# Patient Record
Sex: Female | Born: 1995 | Race: Black or African American | Hispanic: No | Marital: Single | Smoking: Never smoker
Health system: Southern US, Community
[De-identification: ages and names within clinical notes are randomized; demographics above are authoritative.]

## PROBLEM LIST (undated history)

## (undated) DIAGNOSIS — D571 Sickle-cell disease without crisis: Secondary | ICD-10-CM

## (undated) HISTORY — DX: Sickle-cell disease without crisis: D57.1

## (undated) HISTORY — PX: NO PAST SURGERIES: SHX2092

---
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
## 2018-09-04 ENCOUNTER — Emergency Department (HOSPITAL_COMMUNITY): Admission: EM | Admit: 2018-09-04 | Discharge: 2018-09-05 | Discharge status: Absent without leave | Payer: Self-pay

## 2018-09-05 ENCOUNTER — Emergency Department (HOSPITAL_COMMUNITY): Payer: BLUE CROSS/BLUE SHIELD

## 2018-09-05 ENCOUNTER — Other Ambulatory Visit: Payer: Self-pay

## 2018-09-05 ENCOUNTER — Encounter (HOSPITAL_COMMUNITY): Payer: Self-pay | Admitting: Emergency Medicine

## 2018-09-05 ENCOUNTER — Observation Stay (HOSPITAL_COMMUNITY)
Admission: EM | Admit: 2018-09-05 | Discharge: 2018-09-06 | Disposition: A | Discharge status: Home / Self Care | Payer: BLUE CROSS/BLUE SHIELD | Attending: Internal Medicine | Admitting: Internal Medicine

## 2018-09-05 DIAGNOSIS — M542 Cervicalgia: Secondary | ICD-10-CM | POA: Diagnosis not present

## 2018-09-05 DIAGNOSIS — D72829 Elevated white blood cell count, unspecified: Secondary | ICD-10-CM

## 2018-09-05 DIAGNOSIS — M549 Dorsalgia, unspecified: Secondary | ICD-10-CM

## 2018-09-05 DIAGNOSIS — G039 Meningitis, unspecified: Principal | ICD-10-CM | POA: Diagnosis present

## 2018-09-05 DIAGNOSIS — A419 Sepsis, unspecified organism: Secondary | ICD-10-CM

## 2018-09-05 DIAGNOSIS — R509 Fever, unspecified: Secondary | ICD-10-CM | POA: Diagnosis not present

## 2018-09-05 DIAGNOSIS — A4189 Other specified sepsis: Secondary | ICD-10-CM | POA: Insufficient documentation

## 2018-09-05 DIAGNOSIS — M545 Low back pain: Secondary | ICD-10-CM | POA: Insufficient documentation

## 2018-09-05 DIAGNOSIS — G44021 Chronic cluster headache, intractable: Secondary | ICD-10-CM

## 2018-09-05 DIAGNOSIS — R1110 Vomiting, unspecified: Secondary | ICD-10-CM | POA: Insufficient documentation

## 2018-09-05 DIAGNOSIS — D573 Sickle-cell trait: Secondary | ICD-10-CM | POA: Insufficient documentation

## 2018-09-05 DIAGNOSIS — A879 Viral meningitis, unspecified: Secondary | ICD-10-CM

## 2018-09-05 DIAGNOSIS — R51 Headache: Secondary | ICD-10-CM | POA: Diagnosis not present

## 2018-09-05 DIAGNOSIS — R109 Unspecified abdominal pain: Secondary | ICD-10-CM

## 2018-09-05 DIAGNOSIS — Z832 Family history of diseases of the blood and blood-forming organs and certain disorders involving the immune mechanism: Secondary | ICD-10-CM | POA: Diagnosis not present

## 2018-09-05 DIAGNOSIS — R291 Meningismus: Secondary | ICD-10-CM

## 2018-09-05 DIAGNOSIS — R112 Nausea with vomiting, unspecified: Secondary | ICD-10-CM

## 2018-09-05 LAB — CBC
HEMATOCRIT: 40.4 % (ref 36.0–46.0)
Hemoglobin: 13.2 g/dL (ref 12.0–15.0)
MCH: 27.7 pg (ref 26.0–34.0)
MCHC: 32.7 g/dL (ref 30.0–36.0)
MCV: 84.9 fL (ref 80.0–100.0)
Platelets: 287 10*3/uL (ref 150–400)
RBC: 4.76 MIL/uL (ref 3.87–5.11)
RDW: 12.2 % (ref 11.5–15.5)
WBC: 3.1 10*3/uL — ABNORMAL LOW (ref 4.0–10.5)
nRBC: 0 % (ref 0.0–0.2)

## 2018-09-05 LAB — PROTEIN, CSF: Total  Protein, CSF: 139 mg/dL — ABNORMAL HIGH (ref 15–45)

## 2018-09-05 LAB — CSF CELL COUNT WITH DIFFERENTIAL
EOS CSF: 0 % (ref 0–1)
Eosinophils, CSF: 0 % (ref 0–1)
Lymphs, CSF: 94 % — ABNORMAL HIGH (ref 40–80)
Lymphs, CSF: 95 % — ABNORMAL HIGH (ref 40–80)
Monocyte-Macrophage-Spinal Fluid: 4 % — ABNORMAL LOW (ref 15–45)
Monocyte-Macrophage-Spinal Fluid: 5 % — ABNORMAL LOW (ref 15–45)
RBC Count, CSF: 14 /mm3 — ABNORMAL HIGH
RBC Count, CSF: 22 /mm3 — ABNORMAL HIGH
SEGMENTED NEUTROPHILS-CSF: 1 % (ref 0–6)
Segmented Neutrophils-CSF: 1 % (ref 0–6)
TUBE #: 1
TUBE #: 4
WBC, CSF: 425 /mm3 (ref 0–5)
WBC, CSF: 455 /mm3 (ref 0–5)

## 2018-09-05 LAB — COMPREHENSIVE METABOLIC PANEL
ALK PHOS: 36 U/L — AB (ref 38–126)
ALT: 12 U/L (ref 0–44)
AST: 18 U/L (ref 15–41)
Albumin: 3.9 g/dL (ref 3.5–5.0)
Anion gap: 8 (ref 5–15)
BUN: 10 mg/dL (ref 6–20)
CHLORIDE: 106 mmol/L (ref 98–111)
CO2: 26 mmol/L (ref 22–32)
CREATININE: 0.94 mg/dL (ref 0.44–1.00)
Calcium: 9 mg/dL (ref 8.9–10.3)
GFR calc Af Amer: >60 mL/min (ref >60)
GFR calc non Af Amer: >60 mL/min (ref >60)
Glucose, Bld: 113 mg/dL — ABNORMAL HIGH (ref 70–99)
Potassium: 4.2 mmol/L (ref 3.5–5.1)
SODIUM: 140 mmol/L (ref 135–145)
Total Bilirubin: 0.5 mg/dL (ref 0.3–1.2)
Total Protein: 6.8 g/dL (ref 6.5–8.1)

## 2018-09-05 LAB — LIPASE, BLOOD: LIPASE: 56 U/L — AB (ref 11–51)

## 2018-09-05 LAB — I-STAT BETA HCG BLOOD, ED (MC, WL, AP ONLY): I-stat hCG, quantitative: <5 m[IU]/mL (ref <5)

## 2018-09-05 LAB — PATHOLOGIST SMEAR REVIEW

## 2018-09-05 LAB — RAPID HIV SCREEN (HIV 1/2 AB+AG)
HIV 1/2 Antibodies: NONREACTIVE
HIV-1 P24 Antigen - HIV24: NONREACTIVE

## 2018-09-05 LAB — GLUCOSE, CSF: Glucose, CSF: 38 mg/dL — ABNORMAL LOW (ref 40–70)

## 2018-09-05 LAB — I-STAT CG4 LACTIC ACID, ED: LACTIC ACID, VENOUS: 1.36 mmol/L (ref 0.5–1.9)

## 2018-09-05 MED ORDER — ACETAMINOPHEN 325 MG PO TABS
650.0000 mg | ORAL_TABLET | Freq: Once | ORAL | Status: AC | PRN
  Administered 2018-09-05: 650 mg via ORAL
  Filled 2018-09-05: qty 2

## 2018-09-05 MED ORDER — VANCOMYCIN HCL IN DEXTROSE 750-5 MG/150ML-% IV SOLN
750.0000 mg | Freq: Once | INTRAVENOUS | Status: DC
  Filled 2018-09-05: qty 150

## 2018-09-05 MED ORDER — KETOROLAC TROMETHAMINE 15 MG/ML IJ SOLN
15.0000 mg | Freq: Once | INTRAMUSCULAR | Status: AC
  Administered 2018-09-05: 15 mg via INTRAVENOUS
  Filled 2018-09-05: qty 1

## 2018-09-05 MED ORDER — SODIUM CHLORIDE 0.9 % IV BOLUS
1000.0000 mL | Freq: Once | INTRAVENOUS | Status: AC
  Administered 2018-09-05: 1000 mL via INTRAVENOUS

## 2018-09-05 MED ORDER — VANCOMYCIN HCL IN DEXTROSE 750-5 MG/150ML-% IV SOLN
750.0000 mg | Freq: Two times a day (BID) | INTRAVENOUS | Status: DC
  Administered 2018-09-05 – 2018-09-06 (×2): 750 mg via INTRAVENOUS
  Filled 2018-09-05 (×2): qty 150

## 2018-09-05 MED ORDER — SODIUM CHLORIDE 0.9 % IV SOLN
2.0000 g | Freq: Once | INTRAVENOUS | Status: AC
  Administered 2018-09-05: 2 g via INTRAVENOUS
  Filled 2018-09-05 (×2): qty 20

## 2018-09-05 MED ORDER — ONDANSETRON HCL 4 MG/2ML IJ SOLN: 4.0000 mg | Freq: Four times a day (QID) | INTRAMUSCULAR | Status: DC | PRN

## 2018-09-05 MED ORDER — ACETAMINOPHEN 325 MG PO TABS
650.0000 mg | ORAL_TABLET | Freq: Four times a day (QID) | ORAL | Status: DC | PRN
  Administered 2018-09-06 (×2): 650 mg via ORAL
  Filled 2018-09-05 (×2): qty 2

## 2018-09-05 MED ORDER — VANCOMYCIN HCL IN DEXTROSE 1-5 GM/200ML-% IV SOLN
1000.0000 mg | Freq: Once | INTRAVENOUS | Status: AC
  Administered 2018-09-05: 1000 mg via INTRAVENOUS
  Filled 2018-09-05 (×2): qty 200

## 2018-09-05 MED ORDER — SODIUM CHLORIDE 0.9 % IV SOLN: 2.0000 g | Freq: Two times a day (BID) | INTRAVENOUS | Status: DC

## 2018-09-05 MED ORDER — DEXAMETHASONE SODIUM PHOSPHATE 10 MG/ML IJ SOLN
10.0000 mg | Freq: Once | INTRAMUSCULAR | Status: AC
  Administered 2018-09-05: 10 mg via INTRAVENOUS
  Filled 2018-09-05: qty 1

## 2018-09-05 MED ORDER — SODIUM CHLORIDE 0.9 % IV SOLN
INTRAVENOUS | Status: DC
  Administered 2018-09-05 – 2018-09-06 (×3): via INTRAVENOUS

## 2018-09-05 MED ORDER — METOCLOPRAMIDE HCL 5 MG/ML IJ SOLN
10.0000 mg | Freq: Once | INTRAMUSCULAR | Status: AC
  Administered 2018-09-05: 10 mg via INTRAVENOUS
  Filled 2018-09-05: qty 2

## 2018-09-05 MED ORDER — DEXAMETHASONE SODIUM PHOSPHATE 10 MG/ML IJ SOLN
10.0000 mg | Freq: Four times a day (QID) | INTRAMUSCULAR | Status: DC
  Administered 2018-09-05 – 2018-09-06 (×4): 10 mg via INTRAVENOUS
  Filled 2018-09-05 (×5): qty 1

## 2018-09-05 MED ORDER — DIPHENHYDRAMINE HCL 50 MG/ML IJ SOLN
25.0000 mg | Freq: Once | INTRAMUSCULAR | Status: AC
  Administered 2018-09-05: 25 mg via INTRAVENOUS
  Filled 2018-09-05: qty 1

## 2018-09-05 MED ORDER — ENOXAPARIN SODIUM 40 MG/0.4ML ~~LOC~~ SOLN
40.0000 mg | SUBCUTANEOUS | Status: DC
  Filled 2018-09-05: qty 0.4

## 2018-09-05 MED ORDER — KETOROLAC TROMETHAMINE 30 MG/ML IJ SOLN
30.0000 mg | Freq: Four times a day (QID) | INTRAMUSCULAR | Status: DC | PRN
  Administered 2018-09-05 – 2018-09-06 (×2): 30 mg via INTRAVENOUS
  Filled 2018-09-05 (×3): qty 1

## 2018-09-05 MED ORDER — SODIUM CHLORIDE 0.9 % IV SOLN
2.0000 g | Freq: Two times a day (BID) | INTRAVENOUS | Status: DC
  Administered 2018-09-05 – 2018-09-06 (×2): 2 g via INTRAVENOUS
  Filled 2018-09-05 (×2): qty 20

## 2018-09-05 MED ORDER — MORPHINE SULFATE (PF) 4 MG/ML IV SOLN
4.0000 mg | Freq: Once | INTRAVENOUS | Status: AC
  Administered 2018-09-05: 4 mg via INTRAVENOUS
  Filled 2018-09-05: qty 1

## 2018-09-05 NOTE — ED Notes (Signed)
Patient transported to CT 

## 2018-09-05 NOTE — ED Notes (Signed)
[facility] Critical Results given to Dr. Manus Gunning

## 2018-09-05 NOTE — H&P (Addendum)
[facility]    Date of Admission:  09/05/2018   Total days of antibiotics 1          Reason for Consult: Bacterial vs Viral Meningitis    Referring Provider: dr Hanley Ben Primary Care Provider: [facility]  Assessment: Ann Armstrong is a 22 y.o. female with medical history significant of sickle cell trait admitted for concerns of bacterial vs viral meningitis. Tmax on admission 102.7, White count 3.1.  Lactic acid normal.  Chest x-ray showing no active cardiopulmonary disease.  Head CT without acute abnormality.  LP was done in the ED. CSF glucose low (38), protein high (139), WBC high (455).  Differential showing 95% lymphocytes.  CSF culture pending.   Patient received vancomycin, ceftriaxone, and IV Decadron 10 mg in the ED. CSF culture is pending but cells are most consistent with a viral meningitis/aseptic meningitis. Pt reports she is uptodate on vaccinations and is sexually active. She feels improved within 12hrs of management of care.  Plan: 1. Will continue ceftriaxone 2g Q12  Plus vancomycin 2. Monitor fevers, repeat blood cultures if fevers return 3. Continue IV decadron 10mg  q6 4. Follow up on HSV and enterovirus PCR on CSF 5. Follow up on CSF culture 6. Will consider antivirals pending lab results 7. Will discontinue droplet precautions  Principal Problem:   Meningitis Active Problems:   Sepsis (HCC)   Scheduled Meds: . dexamethasone  10 mg Intravenous Q6H  . enoxaparin (LOVENOX) injection  40 mg Subcutaneous Q24H   Continuous Infusions: . sodium chloride Stopped (09/05/18 0719)  . cefTRIAXone (ROCEPHIN)  IV    . vancomycin     PRN Meds:.acetaminophen, ketorolac, ondansetron (ZOFRAN) IV  HPI: Ann Armstrong is a 22 y.o. female with pmhx of sicke cell trait. Pt reports on Tuesday she woke up with a a headache. Thursday she started having lower back pain and fevers. Her neck also started hurting and she had  numerous episodes of emesis. She went to the urgent care Saturday and they started IVF and she began to feel better so they did not pursue an LP. She went to the doctor yesterday because her symptoms progressed and they pursued a head CT which was unrevealing. Pt presented to the ED last night and is accompanied by her mom for headache and fever. She reports that she is feeling better since starting IVF, steroids, abtx and pain medication. 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 Negative for chills.  Eyes: Negative for blurred vision, double vision and photophobia.  Cardiovascular: Negative for chest pain.  Gastrointestinal: Positive for abdominal pain and vomiting.  Musculoskeletal: Positive for back pain and neck pain.  Neurological: Positive for headaches. Negative for weakness.    Past Medical History:  Diagnosis Date  . Sickle cell anemia (HCC)    positive for sickle cell trait- refer to [id number]    Social History   Tobacco Use  . Smoking status: Never Smoker  . Smokeless tobacco: Never Used  Substance Use Topics  . Alcohol use: No  . Drug use: No    Family History  Problem Relation Age of Onset  . Sickle cell trait Father   . Heart disease Maternal Grandmother   .  Hyperlipidemia Maternal Grandmother   . Hypertension Maternal Grandmother   . Stroke Maternal Grandmother   . Arthritis Maternal Grandfather   . Cancer Maternal Grandfather   . Hyperlipidemia Maternal Grandfather   . Hypertension Maternal Grandfather   . Alcohol abuse Paternal Grandmother   . Asthma Neg Hx   . Birth defects Neg Hx   . COPD Neg Hx   . Depression Neg Hx   . Diabetes Neg Hx   . Drug abuse Neg Hx   . Early death Neg Hx   . Hearing loss Neg Hx   .  Kidney disease Neg Hx   . Learning disabilities Neg Hx   . Mental illness Neg Hx   . Mental retardation Neg Hx   . Miscarriages / Stillbirths Neg Hx   . Vision loss Neg Hx   . Varicose Veins Neg Hx    No Known Allergies  OBJECTIVE: BP 126/89   Pulse 81   Temp 98.8 F (37.1 C) (Oral)   Resp 20   Ht 5\' 2"  (1.575 m)   Wt 54.4 kg   LMP 08/31/2018   SpO2 100%   BMI 21.95 kg/m  Physical Exam  Constitutional:  oriented to person, place, and time. appears well-developed and well-nourished. No distress.  HENT: [place]/AT, PERRLA, no scleral icterus + nuchal rigidity Mouth/Throat: Oropharynx is clear and moist. No oropharyngeal exudate.  Cardiovascular: Normal rate, regular rhythm and normal heart sounds. Exam reveals no gallop and no friction rub.  No murmur heard.  Pulmonary/Chest: Effort normal and breath sounds normal. No respiratory distress.  has no wheezes.  Neck = supple, no nuchal rigidity Abdominal: Soft. Bowel sounds are normal.  exhibits no distension. There is no tenderness.  Lymphadenopathy: no cervical adenopathy. No axillary adenopathy Neurological: alert and oriented to person, place, and time.  Skin: Skin is warm and dry. No rash noted. No erythema.  Psychiatric: a normal mood and affect.  behavior is normal.   Lab Results Lab Results  Component Value Date   WBC 3.1 (L) 09/05/2018   HGB 13.2 09/05/2018   HCT 40.4 09/05/2018   MCV 84.9 09/05/2018   PLT 287 09/05/2018    Lab Results  Component Value Date   CREATININE 0.94 09/05/2018   BUN 10 09/05/2018   NA 140 09/05/2018   K 4.2 09/05/2018   CL 106 09/05/2018   CO2 26 09/05/2018    Lab Results  Component Value Date   ALT 12 09/05/2018   AST 18 09/05/2018   ALKPHOS 36 (L) 09/05/2018   BILITOT 0.5 09/05/2018     Microbiology: Recent Results (from the past 240 hour(s))  CSF culture with Stat gram stain     Status: None (Preliminary result)   Collection Time: 09/05/18  3:03 AM  Result Value Ref Range  Status   Specimen Description CSF  Final   Special Requests NONE  Final   Gram Stain   Final    WBC PRESENT, PREDOMINANTLY MONONUCLEAR NO ORGANISMS SEEN CYTOSPIN SMEAR Performed at [facility], [address], [place], [place] [address]    Culture PENDING  Incomplete   Report Status PENDING  Incomplete    April  Eulah Citizen [facility]  09/05/2018, 12:24 PM  ---------------------  I have seen and examined the patient with our medical student, April peterson. I have made additions to the physical exam and plan.  22yo F in graduate studies who has new onset of fever, headache, neck pain with N/V. In the ED,  she underwent LP found to have lymphocytic predominance pleocytosis on CSF analysis. Gram stain negative. Glucose was mildly lower than expected.  - recommend to keep vanco and ceftriaxone til tomorrow am. If cultures still ngtd, can d/c. Anticipated to be aseptic mengitis/viral meningitis. - steroids may help HA symptoms as well   Baili Stang B. Drue Second MD MPH [facility] [phone]

## 2018-09-05 NOTE — ED Triage Notes (Signed)
Pt BIB family, reports headache x 1 week with vomiting and lower back pain that radiates to neck. Fevers x 4 days, mild abdominal pain. Pt seen at [facility] on Saturday, given meloxicam with no relief, pt also seen at [facility] ED for same symptoms, and seen at [facility] today as well. Mother reports CT scan ordered for tomorrow.

## 2018-09-05 NOTE — Progress Notes (Signed)
Pharmacy Antibiotic Note  Ann Armstrong is a 22 y.o. female admitted on 09/05/2018 with sx concerning for meningitis.  Pharmacy has been consulted for vancomycin dosing; also started on Rocephin appropriately.  Plan: Vancomycin 1000mg  x1 then 750mg  IV every 12 hours.  Goal trough 15-20 mcg/mL.  Height: 5\' 2"  (157.5 cm) Weight: 120 lb (54.4 kg) IBW/kg (Calculated) : 50.1  Temp (24hrs), Avg:100.3 F (37.9 C), Min:99 F (37.2 C), Max:102.7 F (39.3 C)  Recent Labs  Lab 09/05/18 0034 09/05/18 0039  WBC 3.1*  --   CREATININE 0.94  --   LATICACIDVEN  --  1.36    Estimated Creatinine Clearance: 74.2 mL/min (by C-G formula based on SCr of 0.94 mg/dL).    No Known Allergies   Thank you for allowing pharmacy to be a part of this patient's care.  Vernard Gambles, PharmD, BCPS  09/05/2018 6:20 AM

## 2018-09-05 NOTE — Progress Notes (Signed)
Patient ID: Ann Armstrong, female   DOB: January 27, 1996, 22 y.o.   MRN: [id number] Patient was admitted early this morning for headaches and fever and started on Rocephin and vancomycin for meningitis.  Patient seen and examined at bedside.  Plan of care discussed with her.  This morning's H&P, labs and medications were reviewed by myself.  Follow CSF cultures.  Gram stain is negative so far.  We will add CSF HSV PCR and enterovirus PCR.  Spoke to Dr. Jerolyn Center about the same.  Repeat a.m. labs.

## 2018-09-05 NOTE — H&P (Addendum)
History and Physical    Ann Armstrong:[id number] DOB: 1996/03/19 DOA: 09/05/2018  PCP: [facility] Patient coming from: Home  Chief Complaint: Headaches, fever  HPI: Ann Armstrong is a 22 y.o. female with medical history significant of sickle cell trait resenting to the hospital for further evaluation of headaches and fever.  Patient states he started having bilateral temporal and orbital headaches a week ago.  5 days ago she started experiencing pain in her neck and lower back.  It is painful for her to flex her neck.  She has been having fevers and chills for the past 3 days.  Started vomiting 2 days ago.   ED Course: Temp 102.7.  White count 3.1.  Lactic acid normal.  Chest x-ray showing no active cardiopulmonary disease.  Head CT without acute abnormality.  LP was done in the ED. CSF glucose low (38), protein high (139), WBC high (455).  Differential showing 95% lymphocytes.  CSF culture pending.  Rapid HIV screen negative.  Patient received vancomycin, ceftriaxone, and IV Decadron 10 mg in the ED.  Received 2 L IV fluid boluses.  TRH patient to admit.  Review of Systems: As per HPI otherwise 10 point review of systems negative.  Past Medical History:  Diagnosis Date  . Sickle cell anemia (HCC)    positive for sickle cell trait- refer to [id number]    History reviewed. No pertinent surgical history.   reports that she has never smoked. She has never used smokeless tobacco. She reports that she does not drink alcohol or use drugs.  No Known Allergies  Family History  Problem Relation Age of Onset  . Sickle cell trait Father   . Heart disease Maternal Grandmother   . Hyperlipidemia Maternal Grandmother   . Hypertension Maternal Grandmother   . Stroke Maternal Grandmother   . Arthritis Maternal Grandfather   . Cancer Maternal Grandfather   . Hyperlipidemia Maternal Grandfather   . Hypertension Maternal Grandfather   . Alcohol abuse Paternal  Grandmother   . Asthma Neg Hx   . Birth defects Neg Hx   . COPD Neg Hx   . Depression Neg Hx   . Diabetes Neg Hx   . Drug abuse Neg Hx   . Early death Neg Hx   . Hearing loss Neg Hx   . Kidney disease Neg Hx   . Learning disabilities Neg Hx   . Mental illness Neg Hx   . Mental retardation Neg Hx   . Miscarriages / Stillbirths Neg Hx   . Vision loss Neg Hx   . Varicose Veins Neg Hx     Prior to Admission medications   Medication Sig Start Date End Date Taking? Authorizing Provider  acetaminophen (TYLENOL) 325 MG tablet Take 650 mg by mouth every 6 (six) hours as needed for mild pain or fever.   Yes [provider]  ibuprofen (ADVIL,MOTRIN) 200 MG tablet Take 200 mg by mouth every 6 (six) hours as needed for fever or mild pain.   Yes [provider]    Physical Exam: Vitals:   09/05/18 0330 09/05/18 0400 09/05/18 0430 09/05/18 0527  BP: 115/80 119/83 116/75 108/71  Pulse: 91 99 97 98  Resp: 17 (!) 21 16 16   Temp:    99 F (37.2 C)  TempSrc:    Oral  SpO2: 99% 100% 99% 98%  Weight:    54.4 kg  Height:    5\' 2"  (1.575 m)   Physical Exam  Constitutional: She is oriented to person, place, and time. No distress.  Resting comfortably in a hospital bed  HENT:  Head: Normocephalic and atraumatic.  Mouth/Throat: Oropharynx is clear and moist.  Eyes: Right eye exhibits no discharge. Left eye exhibits no discharge.  Neck: Neck supple. No tracheal deviation present.  Cardiovascular: Normal rate, regular rhythm and intact distal pulses.  Pulmonary/Chest: Effort normal and breath sounds normal. No respiratory distress. She has no wheezes. She has no rales.  Abdominal: Soft. Bowel sounds are normal. She exhibits no distension. There is no tenderness.  Musculoskeletal: She exhibits no edema.  Neurological: She is alert and oriented to person, place, and time.  Skin: Skin is warm and dry. She is not diaphoretic.  Psychiatric: Her behavior is normal.     Labs on  Admission: I have personally reviewed following labs and imaging studies  CBC: Recent Labs  Lab 09/05/18 0034  WBC 3.1*  HGB 13.2  HCT 40.4  MCV 84.9  PLT 287   Basic Metabolic Panel: Recent Labs  Lab 09/05/18 0034  NA 140  K 4.2  CL 106  CO2 26  GLUCOSE 113*  BUN 10  CREATININE 0.94  CALCIUM 9.0   GFR: Estimated Creatinine Clearance: 74.2 mL/min (by C-G formula based on SCr of 0.94 mg/dL). Liver Function Tests: Recent Labs  Lab 09/05/18 0034  AST 18  ALT 12  ALKPHOS 36*  BILITOT 0.5  PROT 6.8  ALBUMIN 3.9   Recent Labs  Lab 09/05/18 0034  LIPASE 56*   No results for input(s): AMMONIA in the last 168 hours. Coagulation Profile: No results for input(s): INR, PROTIME in the last 168 hours. Cardiac Enzymes: No results for input(s): CKTOTAL, CKMB, CKMBINDEX, TROPONINI in the last 168 hours. BNP (last 3 results) No results for input(s): PROBNP in the last 8760 hours. HbA1C: No results for input(s): HGBA1C in the last 72 hours. CBG: No results for input(s): GLUCAP in the last 168 hours. Lipid Profile: No results for input(s): CHOL, HDL, LDLCALC, TRIG, CHOLHDL, LDLDIRECT in the last 72 hours. Thyroid Function Tests: No results for input(s): TSH, T4TOTAL, FREET4, T3FREE, THYROIDAB in the last 72 hours. Anemia Panel: No results for input(s): VITAMINB12, FOLATE, FERRITIN, TIBC, IRON, RETICCTPCT in the last 72 hours. Urine analysis: No results found for: COLORURINE, APPEARANCEUR, LABSPEC, PHURINE, GLUCOSEU, HGBUR, BILIRUBINUR, KETONESUR, PROTEINUR, UROBILINOGEN, NITRITE, LEUKOCYTESUR  Radiological Exams on Admission: Dg Chest 2 View  Result Date: 09/05/2018 CLINICAL DATA:  Fever, headache EXAM: CHEST - 2 VIEW COMPARISON:  None. FINDINGS: Heart and mediastinal contours are within normal limits. No focal opacities or effusions. No acute bony abnormality. IMPRESSION: No active cardiopulmonary disease. Electronically Signed   By: Charlett Nose M.D.   On: 09/05/2018  02:42   Ct Head Wo Contrast  Result Date: 09/05/2018 CLINICAL DATA:  Headache, acute, severe, worst HA of life. Headache for 1 week. Vomiting. EXAM: CT HEAD WITHOUT CONTRAST TECHNIQUE: Contiguous axial images were obtained from the base of the skull through the vertex without intravenous contrast. COMPARISON:  None. FINDINGS: Brain: No intracranial hemorrhage, mass effect, or midline shift. No hydrocephalus. The basilar cisterns are patent. No evidence of territorial infarct or acute ischemia. No extra-axial or intracranial fluid collection. Vascular: No hyperdense vessel. Skull: No fracture or focal lesion. Sinuses/Orbits: Paranasal sinuses and mastoid air cells are clear. The visualized orbits are unremarkable. Other: None. IMPRESSION: Negative head CT. Electronically Signed   By: Narda Rutherford M.D.   On: 09/05/2018 02:00    Assessment/Plan  Principal Problem:   Meningitis Active Problems:   Sepsis (HCC)   Sepsis secondary to acute meningitis -Patient is presenting with headaches and fevers.  Noted to be febrile in the ED.  White count 3.1 and lactic acid normal. -Meningitis viral versus bacterial -CSF findings showing glucose low (38), protein high (139), WBC high (455).  Differential showing 95% lymphocytes more consistent with viral meningitis. -CSF culture pending -Continue vancomycin and ceftriaxone -Continue IV dexamethasone 10 mg every 6 hours -Toradol as needed for headaches, Tylenol prn -Zofran prn nausea -Received 2 L IV fluid boluses in the ED. Continue maintenance IV fluid. -Blood culture x2 (patient already received antibiotics in the ED) -CBC in am -Consult infectious disease in the morning -Droplet precautions  DVT prophylaxis: Lovenox Family Communication: Parents at bedside updated. Disposition Plan: Anticipate discharge to home in 2 to 3 days. Consults called: None Admission status: It is my clinical opinion that admission to INPATIENT is reasonable and  necessary in this 22 y.o. female . presenting with symptoms of headaches, fevers, concerning for acute meningitis . and pertinent positives on radiographic and laboratory data including: CSF studies indicative of meningitis (viral versus bacterial) . Workup and treatment include IV antibiotics, IV steroid  Given the aforementioned, the predictability of an adverse outcome is felt to be significant. I expect that the patient will require at least 2 midnights in the hospital to treat this condition.   John Giovanni MD [facility] Pager [phone]  If 7PM-7AM, please contact night-coverage [email or web address] Password [facility]  09/05/2018, 6:21 AM

## 2018-09-05 NOTE — ED Provider Notes (Addendum)
[facility] EMERGENCY DEPARTMENT Provider Note   CSN: [id number] Arrival date & time: 09/05/18  0021     History   Chief Complaint Chief Complaint  Patient presents with  . Headache  . Fever    HPI Ann Armstrong is a 22 y.o. female.  Patient presents to the emergency department with a chief complaint of fever and headache.  She reports symptom onset about 5 days ago.  She states that her symptoms have been gradually worsening.  She was seen at a fast bed in [place], then at [facility] emergency department.  She was then seen at [facility] medical center today when her symptoms in her back and neck started to worsen.  She reports neck stiffness, which is worsened with flexion.  She does complain of muscle soreness in her upper neck and back.  She denies any history of migraines.  She states that headache is gradually worsened.  Denies ever having headache like this before.  She reports that she had a CT scan ordered by [facility] for tomorrow morning.  She has tried taking OTC antipyretics as well as meloxicam with no relief of her symptoms.  She reports associated photophobia.  She denies any other associated symptoms.  The history is provided by the patient. No language interpreter was used.    Past Medical History:  Diagnosis Date  . Sickle cell anemia (HCC)    positive for sickle cell trait- refer to [id number]    Patient Active Problem List   Diagnosis Date Noted  . Well child check 05/22/2014  . BMI (body mass index), pediatric, 5% to less than 85% for age 52/07/2014    History reviewed. No pertinent surgical history.   OB History   None      Home Medications    Prior to Admission medications   Not on File    Family History Family History  Problem Relation Age of Onset  . Sickle cell trait Father   . Heart disease Maternal Grandmother   . Hyperlipidemia Maternal Grandmother   . Hypertension Maternal Grandmother   .  Stroke Maternal Grandmother   . Arthritis Maternal Grandfather   . Cancer Maternal Grandfather   . Hyperlipidemia Maternal Grandfather   . Hypertension Maternal Grandfather   . Alcohol abuse Paternal Grandmother   . Asthma Neg Hx   . Birth defects Neg Hx   . COPD Neg Hx   . Depression Neg Hx   . Diabetes Neg Hx   . Drug abuse Neg Hx   . Early death Neg Hx   . Hearing loss Neg Hx   . Kidney disease Neg Hx   . Learning disabilities Neg Hx   . Mental illness Neg Hx   . Mental retardation Neg Hx   . Miscarriages / Stillbirths Neg Hx   . Vision loss Neg Hx   . Varicose Veins Neg Hx     Social History Social History   Tobacco Use  . Smoking status: Never Smoker  . Smokeless tobacco: Never Used  Substance Use Topics  . Alcohol use: No  . Drug use: No     Allergies   Patient has no known allergies.   Review of Systems Review of Systems  All other systems reviewed and are negative.    Physical Exam Updated Vital Signs BP 115/81 (BP Location: Right Arm)   Pulse (!) 108   Temp (!) 102.7 F (39.3 C) (Oral)   LMP 08/31/2018   SpO2  100%   Physical Exam  Constitutional: She is oriented to person, place, and time. She appears well-developed and well-nourished.  HENT:  Head: Normocephalic and atraumatic.  Right Ear: External ear normal.  Left Ear: External ear normal.  Eyes: Pupils are equal, round, and reactive to light. Conjunctivae and EOM are normal.  Neck: Normal range of motion. Neck supple.  No pain with neck flexion, no meningismus  Cardiovascular: Normal rate, regular rhythm and normal heart sounds. Exam reveals no gallop and no friction rub.  No murmur heard. Pulmonary/Chest: Effort normal and breath sounds normal. No respiratory distress. She has no wheezes. She has no rales. She exhibits no tenderness.  Abdominal: Soft. She exhibits no distension and no mass. There is no tenderness. There is no rebound and no guarding.  Musculoskeletal: Normal range of  motion. She exhibits no edema or tenderness.  Normal gait.  Neurological: She is alert and oriented to person, place, and time. She has normal reflexes.  CN 3-12 intact, normal finger to nose, no pronator drift, sensation and strength intact bilaterally.  Skin: Skin is warm and dry.  Psychiatric: She has a normal mood and affect. Her behavior is normal. Judgment and thought content normal.  Nursing note and vitals reviewed.    ED Treatments / Results  Labs (all labs ordered are listed, but only abnormal results are displayed) Labs Reviewed  LIPASE, BLOOD - Abnormal; Notable for the following components:      Result Value   Lipase 56 (*)    All other components within normal limits  COMPREHENSIVE METABOLIC PANEL - Abnormal; Notable for the following components:   Glucose, Bld 113 (*)    Alkaline Phosphatase 36 (*)    All other components within normal limits  CBC - Abnormal; Notable for the following components:   WBC 3.1 (*)    All other components within normal limits  GLUCOSE, CSF - Abnormal; Notable for the following components:   Glucose, CSF 38 (*)    All other components within normal limits  PROTEIN, CSF - Abnormal; Notable for the following components:   Total  Protein, CSF 139 (*)    All other components within normal limits  CSF CELL COUNT WITH DIFFERENTIAL - Abnormal; Notable for the following components:   RBC Count, CSF 22 (*)    WBC, CSF 425 (*)    Lymphs, CSF 94 (*)    Monocyte-Macrophage-Spinal Fluid 5 (*)    All other components within normal limits  CSF CELL COUNT WITH DIFFERENTIAL - Abnormal; Notable for the following components:   RBC Count, CSF 14 (*)    WBC, CSF 455 (*)    All other components within normal limits  CSF CULTURE  RAPID HIV SCREEN (HIV 1/2 AB+AG)  URINALYSIS, ROUTINE W REFLEX MICROSCOPIC  I-STAT BETA HCG BLOOD, ED (MC, WL, AP ONLY)  I-STAT CG4 LACTIC ACID, ED    EKG None  Radiology Dg Chest 2 View  Result Date:  09/05/2018 CLINICAL DATA:  Fever, headache EXAM: CHEST - 2 VIEW COMPARISON:  None. FINDINGS: Heart and mediastinal contours are within normal limits. No focal opacities or effusions. No acute bony abnormality. IMPRESSION: No active cardiopulmonary disease. Electronically Signed   By: Charlett Nose M.D.   On: 09/05/2018 02:42   Ct Head Wo Contrast  Result Date: 09/05/2018 CLINICAL DATA:  Headache, acute, severe, worst HA of life. Headache for 1 week. Vomiting. EXAM: CT HEAD WITHOUT CONTRAST TECHNIQUE: Contiguous axial images were obtained from the base of the  skull through the vertex without intravenous contrast. COMPARISON:  None. FINDINGS: Brain: No intracranial hemorrhage, mass effect, or midline shift. No hydrocephalus. The basilar cisterns are patent. No evidence of territorial infarct or acute ischemia. No extra-axial or intracranial fluid collection. Vascular: No hyperdense vessel. Skull: No fracture or focal lesion. Sinuses/Orbits: Paranasal sinuses and mastoid air cells are clear. The visualized orbits are unremarkable. Other: None. IMPRESSION: Negative head CT. Electronically Signed   By: Narda Rutherford M.D.   On: 09/05/2018 02:00    Procedures .Lumbar Puncture Date/Time: 09/05/2018 3:00 AM Performed by: Roxy Horseman, PA-C Authorized by: Roxy Horseman, PA-C   Consent:    Consent obtained:  Written   Consent given by:  Patient and parent   Risks discussed:  Bleeding, infection, pain, headache, nerve damage and repeat procedure   Alternatives discussed:  No treatment Pre-procedure details:    Procedure purpose:  Diagnostic   Preparation: Patient was prepped and draped in usual sterile fashion   Anesthesia (see MAR for exact dosages):    Anesthesia method:  Local infiltration   Local anesthetic:  Lidocaine 1% w/o epi Procedure details:    Lumbar space:  L4-L5 interspace   Patient position:  Sitting   Needle gauge:  18   Needle type:  Spinal needle - Quincke tip   Needle  length (in):  3.5   Ultrasound guidance: no     Number of attempts:  1   Fluid appearance:  Clear   Tubes of fluid:  4   Total volume (ml):  8 Post-procedure:    Puncture site:  Adhesive bandage applied and direct pressure applied   Patient tolerance of procedure:  Tolerated well, no immediate complications   (including critical care time) CRITICAL CARE Performed by: Roxy Horseman   Total critical care time: 47 minutes  Critical care time was exclusive of separately billable procedures and treating other patients.  Critical care was necessary to treat or prevent imminent or life-threatening deterioration.  Critical care was time spent personally by me on the following activities: development of treatment plan with patient and/or surrogate as well as nursing, discussions with consultants, evaluation of patient's response to treatment, examination of patient, obtaining history from patient or surrogate, ordering and performing treatments and interventions, ordering and review of laboratory studies, ordering and review of radiographic studies, pulse oximetry and re-evaluation of patient's condition.  Medications Ordered in ED Medications  sodium chloride 0.9 % bolus 1,000 mL (1,000 mLs Intravenous New Bag/Given 09/05/18 0420)  cefTRIAXone (ROCEPHIN) 2 g in sodium chloride 0.9 % 100 mL IVPB (has no administration in time range)  dexamethasone (DECADRON) injection 10 mg (has no administration in time range)  vancomycin (VANCOCIN) IVPB 1000 mg/200 mL premix (has no administration in time range)  acetaminophen (TYLENOL) tablet 650 mg (650 mg Oral Given 09/05/18 0041)  sodium chloride 0.9 % bolus 1,000 mL (0 mLs Intravenous Stopped 09/05/18 0303)  morphine 4 MG/ML injection 4 mg (4 mg Intravenous Given 09/05/18 0132)  metoCLOPramide (REGLAN) injection 10 mg (10 mg Intravenous Given 09/05/18 0420)  ketorolac (TORADOL) 15 MG/ML injection 15 mg (15 mg Intravenous Given 09/05/18 0420)   diphenhydrAMINE (BENADRYL) injection 25 mg (25 mg Intravenous Given 09/05/18 0420)     Initial Impression / Assessment and Plan / ED Course  I have reviewed the triage vital signs and the nursing notes.  Pertinent labs & imaging results that were available during my care of the patient were reviewed by me and considered in my medical decision  making (see chart for details).     Patient with headache and fever.  This is her fourth visit in 4 days.  She has been seen at an urgent care, the [facility] emergency department, and [facility].  At this time, suspicion for bacterial meningitis is very low given length of illness, however patient and parent are very concerned and would like to proceed with LP to rule this out.  I did discuss that it is unlikely that she has this, but they want to know for certain.    Patient had viral respiratory panel performed at [facility], which was negative.  She denies any productive cough.  Denies dysuria.  Laboratory work-up is reassuring.  [facility] ordered CT for tomorrow morning, will get this here tonight.  Appreciate Dr. Loney Loh for admitting.   MDM Reviewed: previous chart and vitals Reviewed previous: labs (normal respiratory panel at [facility]) Interpretation: labs, x-ray and CT scan (CSF WBC elevated at 455, CSF protein elevated at 139, CSF glucose slightly low at 38) Total time providing critical care: 30-74 minutes (47). This excludes time spent performing separately reportable procedures and services. Consults: admitting MD     Final Clinical Impressions(s) / ED Diagnoses   Final diagnoses:  Meningitis    ED Discharge Orders    None       Roxy Horseman, PA-C 09/05/18 0424    Roxy Horseman, PA-C 09/05/18 [id number]    Glynn Octave, MD 09/05/18 [phone]

## 2018-09-06 ENCOUNTER — Encounter: Payer: Self-pay | Admitting: Internal Medicine

## 2018-09-06 DIAGNOSIS — G039 Meningitis, unspecified: Secondary | ICD-10-CM | POA: Diagnosis not present

## 2018-09-06 DIAGNOSIS — A879 Viral meningitis, unspecified: Secondary | ICD-10-CM | POA: Diagnosis not present

## 2018-09-06 DIAGNOSIS — D573 Sickle-cell trait: Secondary | ICD-10-CM | POA: Diagnosis not present

## 2018-09-06 DIAGNOSIS — R291 Meningismus: Secondary | ICD-10-CM

## 2018-09-06 DIAGNOSIS — G44021 Chronic cluster headache, intractable: Secondary | ICD-10-CM

## 2018-09-06 DIAGNOSIS — A419 Sepsis, unspecified organism: Secondary | ICD-10-CM | POA: Diagnosis not present

## 2018-09-06 LAB — COMPREHENSIVE METABOLIC PANEL
ALT: 11 U/L (ref 0–44)
ANION GAP: 7 (ref 5–15)
AST: 14 U/L — ABNORMAL LOW (ref 15–41)
Albumin: 3.3 g/dL — ABNORMAL LOW (ref 3.5–5.0)
Alkaline Phosphatase: 31 U/L — ABNORMAL LOW (ref 38–126)
BUN: 7 mg/dL (ref 6–20)
CALCIUM: 8.8 mg/dL — AB (ref 8.9–10.3)
CO2: 23 mmol/L (ref 22–32)
Chloride: 109 mmol/L (ref 98–111)
Creatinine, Ser: 0.71 mg/dL (ref 0.44–1.00)
GFR calc non Af Amer: >60 mL/min (ref >60)
Glucose, Bld: 150 mg/dL — ABNORMAL HIGH (ref 70–99)
Potassium: 4.1 mmol/L (ref 3.5–5.1)
SODIUM: 139 mmol/L (ref 135–145)
Total Bilirubin: 0.3 mg/dL (ref 0.3–1.2)
Total Protein: 5.9 g/dL — ABNORMAL LOW (ref 6.5–8.1)

## 2018-09-06 LAB — MAGNESIUM: Magnesium: 2 mg/dL (ref 1.7–2.4)

## 2018-09-06 LAB — CBC
HEMATOCRIT: 35.5 % — AB (ref 36.0–46.0)
Hemoglobin: 11.6 g/dL — ABNORMAL LOW (ref 12.0–15.0)
MCH: 27.7 pg (ref 26.0–34.0)
MCHC: 32.7 g/dL (ref 30.0–36.0)
MCV: 84.7 fL (ref 80.0–100.0)
NRBC: 0 % (ref 0.0–0.2)
Platelets: 322 10*3/uL (ref 150–400)
RBC: 4.19 MIL/uL (ref 3.87–5.11)
RDW: 12.7 % (ref 11.5–15.5)
WBC: 2.9 10*3/uL — AB (ref 4.0–10.5)

## 2018-09-06 NOTE — Progress Notes (Deleted)
    [facility]    Date of Admission:  09/05/2018   Total days of antibiotics 1   ID: Ann Armstrong is a 22 y.o. female with  Viral meningtis Principal Problem:   Meningitis Active Problems:   Sepsis (HCC)    Subjective: Afebrile, less headache  O: at 36hr all cultures are negative  Medications:  . enoxaparin (LOVENOX) injection  40 mg Subcutaneous Q24H    Objective: Vital signs in last 24 hours: Temp:  [98.3 F (36.8 C)-98.9 F (37.2 C)] 98.3 F (36.8 C) (10/24 0931) Pulse Rate:  [53-81] 64 (10/24 0931) Resp:  [18-20] 18 (10/24 0515) BP: (118-126)/(82-94) 124/94 (10/24 0931) SpO2:  [99 %-100 %] 100 % (10/24 0931) Weight:  [55.9 kg] 55.9 kg (10/24 0515)     Lab Results Recent Labs    09/05/18 0034 09/06/18 0504 09/06/18 0505  WBC 3.1*  --  2.9*  HGB 13.2  --  11.6*  HCT 40.4  --  35.5*  NA 140 139  --   K 4.2 4.1  --   CL 106 109  --   CO2 26 23  --   BUN 10 7  --   CREATININE 0.94 0.71  --    Liver Panel Recent Labs    09/05/18 0034 09/06/18 0504  PROT 6.8 5.9*  ALBUMIN 3.9 3.3*  AST 18 14*  ALT 12 11  ALKPHOS 36* 31*  BILITOT 0.5 0.3    Microbiology: reviewed Studies/Results: Dg Chest 2 View  Result Date: 09/05/2018 CLINICAL DATA:  Fever, headache EXAM: CHEST - 2 VIEW COMPARISON:  None. FINDINGS: Heart and mediastinal contours are within normal limits. No focal opacities or effusions. No acute bony abnormality. IMPRESSION: No active cardiopulmonary disease. Electronically Signed   By: Charlett Nose M.D.   On: 09/05/2018 02:42   Ct Head Wo Contrast  Result Date: 09/05/2018 CLINICAL DATA:  Headache, acute, severe, worst HA of life. Headache for 1 week. Vomiting. EXAM: CT HEAD WITHOUT CONTRAST TECHNIQUE: Contiguous axial images were obtained from the base of the skull through the vertex without intravenous contrast. COMPARISON:  None. FINDINGS: Brain: No intracranial hemorrhage, mass effect, or midline shift.  No hydrocephalus. The basilar cisterns are patent. No evidence of territorial infarct or acute ischemia. No extra-axial or intracranial fluid collection. Vascular: No hyperdense vessel. Skull: No fracture or focal lesion. Sinuses/Orbits: Paranasal sinuses and mastoid air cells are clear. The visualized orbits are unremarkable. Other: None. IMPRESSION: Negative head CT. Electronically Signed   By: Narda Rutherford M.D.   On: 09/05/2018 02:00     Assessment/Plan: Viral/aseptic meningitis = recommend to continue with supportive care for headache. No longer needs abtx. Can discharge to home and we can call if any other changes in micro results come up.  [facility] for Infectious Diseases Cell: [phone] Pager: [phone]  09/06/2018, 10:28 AM

## 2018-09-06 NOTE — Progress Notes (Signed)
Patient ID: Ann RUFFNER, female   DOB: 1996-07-08, 22 y.o.   MRN: [id number]  PROGRESS NOTE    SHIRL WEIR  WUJ:[id number] DOB: September 12, 1996 DOA: 09/05/2018 PCP: [facility]   Brief Narrative:  22 year old female with a past medical history of sickle cell trait presented for evaluation of headaches and fever.  CT head was negative for acute abnormality.  LP showed 455 WBCs with high protein of 139.  She was started on intravenous antibiotics and steroids.  ID was consulted.   Assessment & Plan:   Principal Problem:   Meningitis Active Problems:   Sepsis (HCC)   Acute bacterial versus viral meningitis -Symptoms are improving.  LP showed 455 WBC, 95% lymphocytes.  CSF culture pending.  Currently on Rocephin, vancomycin and Decadron.  ID following.  HSV and enterovirus PCR pending. -Will discontinue Decadron as there is no clear-cut evidence of Streptococcus meningitis -afebrile.  Await further ID recommendations  Sepsis secondary to above -Hemodynamically stable.  Will discontinue IV fluids if oral intake is adequate.     DVT prophylaxis: Lovenox Code Status: Full Family Communication: Mother at bedside Disposition Plan: Home in 1 to 2 days once cleared by ID  Consultants: ID  Procedures: None  Antimicrobials: Vancomycin and Rocephin from 09/05/2018 onwards   Subjective: Patient seen and examined at bedside.  She feels better.  Her headache is improving.  No overnight fever or vomiting.  Objective: Vitals:   09/05/18 1433 09/05/18 1940 09/06/18 0515 09/06/18 0931  BP: 126/89 124/89 118/82 (!) 124/94  Pulse: 81 61 (!) 53 64  Resp: 20 18 18    Temp: 98.8 F (37.1 C) 98.6 F (37 C) 98.9 F (37.2 C) 98.3 F (36.8 C)  TempSrc: Oral Oral Oral Oral  SpO2: 100% 99% 100% 100%  Weight:   55.9 kg   Height:        Intake/Output Summary (Last 24 hours) at 09/06/2018 1003 Last data filed at 09/06/2018 0352 Gross per 24 hour  Intake 1815.15 ml    Output -  Net 1815.15 ml   Filed Weights   09/05/18 0527 09/06/18 0515  Weight: 54.4 kg 55.9 kg    Examination:  General exam: Appears calm and comfortable, no distress Respiratory system: Bilateral decreased breath sounds at bases Cardiovascular system: S1 & S2 heard, Rate controlled Gastrointestinal system: Abdomen is nondistended, soft and nontender. Normal bowel sounds heard. Extremities: No cyanosis, clubbing, edema  Central nervous system: Alert and oriented. No focal neurological deficits. Moving extremities   Data Reviewed: I have personally reviewed following labs and imaging studies  CBC: Recent Labs  Lab 09/05/18 0034 09/06/18 0505  WBC 3.1* 2.9*  HGB 13.2 11.6*  HCT 40.4 35.5*  MCV 84.9 84.7  PLT 287 322   Basic Metabolic Panel: Recent Labs  Lab 09/05/18 0034 09/06/18 0504  NA 140 139  K 4.2 4.1  CL 106 109  CO2 26 23  GLUCOSE 113* 150*  BUN 10 7  CREATININE 0.94 0.71  CALCIUM 9.0 8.8*  MG  --  2.0   GFR: Estimated Creatinine Clearance: 87.2 mL/min (by C-G formula based on SCr of 0.71 mg/dL). Liver Function Tests: Recent Labs  Lab 09/05/18 0034 09/06/18 0504  AST 18 14*  ALT 12 11  ALKPHOS 36* 31*  BILITOT 0.5 0.3  PROT 6.8 5.9*  ALBUMIN 3.9 3.3*   Recent Labs  Lab 09/05/18 0034  LIPASE 56*   No results for input(s): AMMONIA in the last 168 hours. Coagulation Profile:  No results for input(s): INR, PROTIME in the last 168 hours. Cardiac Enzymes: No results for input(s): CKTOTAL, CKMB, CKMBINDEX, TROPONINI in the last 168 hours. BNP (last 3 results) No results for input(s): PROBNP in the last 8760 hours. HbA1C: No results for input(s): HGBA1C in the last 72 hours. CBG: No results for input(s): GLUCAP in the last 168 hours. Lipid Profile: No results for input(s): CHOL, HDL, LDLCALC, TRIG, CHOLHDL, LDLDIRECT in the last 72 hours. Thyroid Function Tests: No results for input(s): TSH, T4TOTAL, FREET4, T3FREE, THYROIDAB in the last  72 hours. Anemia Panel: No results for input(s): VITAMINB12, FOLATE, FERRITIN, TIBC, IRON, RETICCTPCT in the last 72 hours. Sepsis Labs: Recent Labs  Lab 09/05/18 0039  LATICACIDVEN 1.36    Recent Results (from the past 240 hour(s))  CSF culture with Stat gram stain     Status: None (Preliminary result)   Collection Time: 09/05/18  3:03 AM  Result Value Ref Range Status   Specimen Description CSF  Final   Special Requests NONE  Final   Gram Stain   Final    WBC PRESENT, PREDOMINANTLY MONONUCLEAR NO ORGANISMS SEEN CYTOSPIN SMEAR    Culture   Final    NO GROWTH 1 DAY Performed at [facility], [address] [place], [place] [address]    Report Status PENDING  Incomplete         Radiology Studies: Dg Chest 2 View  Result Date: 09/05/2018 CLINICAL DATA:  Fever, headache EXAM: CHEST - 2 VIEW COMPARISON:  None. FINDINGS: Heart and mediastinal contours are within normal limits. No focal opacities or effusions. No acute bony abnormality. IMPRESSION: No active cardiopulmonary disease. Electronically Signed   By: Charlett Nose M.D.   On: 09/05/2018 02:42   Ct Head Wo Contrast  Result Date: 09/05/2018 CLINICAL DATA:  Headache, acute, severe, worst HA of life. Headache for 1 week. Vomiting. EXAM: CT HEAD WITHOUT CONTRAST TECHNIQUE: Contiguous axial images were obtained from the base of the skull through the vertex without intravenous contrast. COMPARISON:  None. FINDINGS: Brain: No intracranial hemorrhage, mass effect, or midline shift. No hydrocephalus. The basilar cisterns are patent. No evidence of territorial infarct or acute ischemia. No extra-axial or intracranial fluid collection. Vascular: No hyperdense vessel. Skull: No fracture or focal lesion. Sinuses/Orbits: Paranasal sinuses and mastoid air cells are clear. The visualized orbits are unremarkable. Other: None. IMPRESSION: Negative head CT. Electronically Signed   By: Narda Rutherford M.D.   On: 09/05/2018 02:00         Scheduled Meds: . dexamethasone  10 mg Intravenous Q6H  . enoxaparin (LOVENOX) injection  40 mg Subcutaneous Q24H   Continuous Infusions: . sodium chloride 100 mL/hr at 09/06/18 0603  . cefTRIAXone (ROCEPHIN)  IV 2 g (09/06/18 0505)  . vancomycin 750 mg (09/06/18 0918)     LOS: 1 day        Glade Lloyd, MD [facility] Pager [phone]  If 7PM-7AM, please contact night-coverage [email or web address] Password [email or web address] 09/06/2018, 10:03 AM

## 2018-09-06 NOTE — Plan of Care (Signed)
  Problem: Education: Goal: Knowledge of General Education information will improve Description Including pain rating scale, medication(s)/side effects and non-pharmacologic comfort measures Outcome: Progressing   Problem: Clinical Measurements: Goal: Ability to maintain clinical measurements within normal limits will improve Outcome: Progressing   Problem: Clinical Measurements: Goal: Diagnostic test results will improve Outcome: Progressing   Problem: Safety: Goal: Ability to remain free from injury will improve Outcome: Progressing   Problem: Education: Goal: Awareness of infection prevention will improve Outcome: Progressing

## 2018-09-06 NOTE — Progress Notes (Addendum)
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 Recommend continue supportive care for HA, abx no longer needed (pt was on vanc and rocephin). CSF cultures NGTD at 24h, CSF gram stain with WBC predominately mononuclear, no organisms. Blood culture NGTD at 24h. However still has ongoing headache, both with laying down as well as sitting up  PLAN: 1. Headache = Continue with symptomatic management of headache. Concern she may have post-LP HA. If it does not resolve over the next 3-4 days may need blood patch. 2.  Viral/aseptic meningitis = recommend to continue with supportive care for headache. No longer needs abtx.Will follow up on HSV PCR and Enterovirus panel. Can discharge to home and we can call if any other changes in micro results come up.  Principal Problem:   Meningitis Active Problems:   Sepsis (HCC)   Scheduled Meds: . dexamethasone  10 mg Intravenous Q6H  . enoxaparin (LOVENOX) injection  40 mg Subcutaneous Q24H   Continuous Infusions: . sodium chloride 100 mL/hr at 09/06/18 0603  . cefTRIAXone (ROCEPHIN)  IV 2 g (09/06/18 0505)  . vancomycin 750 mg (09/06/18 0918)   PRN Meds:.acetaminophen, ketorolac, ondansetron (ZOFRAN) IV   SUBJECTIVE: Pt reports she is feeling a lot worse this morning, received Toradol about 30 minutes ago. Says overnight she felt well but when she went to the bathroom she started feeling worse.   Review of Systems: Review of Systems  Constitutional: Negative for chills and fever.  HENT: Negative for tinnitus.   Eyes: Negative for blurred vision, double vision and photophobia.  Musculoskeletal: Positive for back pain and neck pain.  Neurological: Positive for headaches.    No Known Allergies  OBJECTIVE: Vitals:   09/05/18 1433 09/05/18 1940 09/06/18 0515 09/06/18 0931  BP: 126/89  124/89 118/82 (!) 124/94  Pulse: 81 61 (!) 53 64  Resp: 20 18 18    Temp: 98.8 F (37.1 C) 98.6 F (37 C) 98.9 F (37.2 C) 98.3 F (36.8 C)  TempSrc: Oral Oral Oral Oral  SpO2: 100% 99% 100% 100%  Weight:   55.9 kg   Height:       Body mass index is 22.55 kg/m.  Physical Exam  Constitutional: She is oriented to person, place, and time. She appears well-developed. She does not appear ill. No distress.  HENT:  Head: Normocephalic and atraumatic.  Eyes: Pupils are equal, round, and reactive to light. EOM are normal.  Neck: Neck rigidity present.  Pulmonary/Chest: Effort normal and breath sounds normal.  Neurological: She is alert and oriented to person, place, and time.  Skin: Skin is warm and dry.    Lab Results Lab Results  Component Value Date   WBC 2.9 (L) 09/06/2018   HGB 11.6 (L) 09/06/2018   HCT 35.5 (L) 09/06/2018   MCV 84.7 09/06/2018   PLT 322 09/06/2018    Lab Results  Component Value Date   CREATININE 0.71 09/06/2018   BUN 7 09/06/2018   NA 139 09/06/2018   K 4.1 09/06/2018   CL 109 09/06/2018   CO2 23 09/06/2018    Lab Results  Component Value Date   ALT 11 09/06/2018   AST 14 (L) 09/06/2018   ALKPHOS 31 (L) 09/06/2018   BILITOT 0.3 09/06/2018     Microbiology: Recent Results (from the  past 240 hour(s))  CSF culture with Stat gram stain     Status: None (Preliminary result)   Collection Time: 09/05/18  3:03 AM  Result Value Ref Range Status   Specimen Description CSF  Final   Special Requests NONE  Final   Gram Stain   Final    WBC PRESENT, PREDOMINANTLY MONONUCLEAR NO ORGANISMS SEEN CYTOSPIN SMEAR    Culture   Final    NO GROWTH 1 DAY Performed at [facility], [address] [place], [place] [address]    Report Status PENDING  Incomplete    April  A Peterson, MS4 09/06/2018, 10:04 AM --------------------------------------------------  I have seen and examined the patient that was seen by our medical student. I have made  changes to the assessment and plan. Symptomatic viral meningitis. Also gave work release note while she rests at home throughout the weekend. Will check in on her this weekend to see if HA are improved.  Duke Salvia Drue Second MD MPH [facility] [phone]

## 2018-09-06 NOTE — Discharge Summary (Signed)
Physician Discharge Summary  PRESLY STEINRUCK ZOX:[id number] DOB: 1996-04-15 DOA: 09/05/2018  PCP: [facility]  Admit date: 09/05/2018 Discharge date: 09/06/2018  Admitted From: Home Disposition: Home Recommendations for Outpatient Follow-up:  1. Follow up with PCP in 1 week 2. Follow-up in the ED if symptoms worsen or new appear   Home Health: No Equipment/Devices: None  Discharge Condition: Stable CODE STATUS: Full Diet recommendation:  Regular  Brief/Interim Summary: 22 year old female with a past medical history of sickle cell trait presented for evaluation of headaches and fever.  CT head was negative for acute abnormality.  LP showed 455 WBCs with high protein of 139.  She was started on intravenous antibiotics and steroids.  ID was consulted.  Patient's condition improved.  Cultures have been negative so far.  Antibiotics have been discontinued.  ID has cleared the patient for discharge.  Discharge Diagnoses:  Principal Problem:   Meningitis Active Problems:   Sepsis (HCC)  Acute probable viral meningitis - LP showed 455 WBC, 95% lymphocytes.  CSF culture pending.   -Patient was seen briefly started on Rocephin, vancomycin and Decadron.  ID was consulted.   -Symptoms have much improved.  No temperature spikes.  HSV and enterovirus PCR are pending.  Patient most likely has viral meningitis.  ID has cleared the patient for discharge and discontinued antibiotics.  Decadron has been already discontinued.  -HSV and enterovirus results will be followed by ID and if something turns out positive, patient will be notified by ID.  Sepsis secondary to above -Hemodynamically stable.  Resolved.  Discharge Instructions  Discharge Instructions    Diet general   Complete by:  As directed    Increase activity slowly   Complete by:  As directed      Allergies as of 09/06/2018   No Known Allergies     Medication List    TAKE these medications   acetaminophen  325 MG tablet Commonly known as:  TYLENOL Take 650 mg by mouth every 6 (six) hours as needed for mild pain or fever.   ibuprofen 200 MG tablet Commonly known as:  ADVIL,MOTRIN Take 200 mg by mouth every 6 (six) hours as needed for fever or mild pain.      Follow-up Information    [facility]. Schedule an appointment as soon as possible for a visit in 1 week(s).   Contact information: [address] [place] [address] [phone]          No Known Allergies  Consultations:  ID   Procedures/Studies: Dg Chest 2 View  Result Date: 09/05/2018 CLINICAL DATA:  Fever, headache EXAM: CHEST - 2 VIEW COMPARISON:  None. FINDINGS: Heart and mediastinal contours are within normal limits. No focal opacities or effusions. No acute bony abnormality. IMPRESSION: No active cardiopulmonary disease. Electronically Signed   By: Charlett Nose M.D.   On: 09/05/2018 02:42   Ct Head Wo Contrast  Result Date: 09/05/2018 CLINICAL DATA:  Headache, acute, severe, worst HA of life. Headache for 1 week. Vomiting. EXAM: CT HEAD WITHOUT CONTRAST TECHNIQUE: Contiguous axial images were obtained from the base of the skull through the vertex without intravenous contrast. COMPARISON:  None. FINDINGS: Brain: No intracranial hemorrhage, mass effect, or midline shift. No hydrocephalus. The basilar cisterns are patent. No evidence of territorial infarct or acute ischemia. No extra-axial or intracranial fluid collection. Vascular: No hyperdense vessel. Skull: No fracture or focal lesion. Sinuses/Orbits: Paranasal sinuses and mastoid air cells are clear. The visualized orbits are unremarkable.  Other: None. IMPRESSION: Negative head CT. Electronically Signed   By: Narda Rutherford M.D.   On: 09/05/2018 02:00      Subjective: Patient seen and examined at bedside.  She feels better.  Her headache is improving.  No overnight fever or vomiting.  Discharge Exam: Vitals:   09/06/18 0931 09/06/18 1553   BP: (!) 124/94 117/80  Pulse: 64 64  Resp:  18  Temp: 98.3 F (36.8 C) 98.3 F (36.8 C)  SpO2: 100% 98%   Vitals:   09/05/18 1940 09/06/18 0515 09/06/18 0931 09/06/18 1553  BP: 124/89 118/82 (!) 124/94 117/80  Pulse: 61 (!) 53 64 64  Resp: 18 18  18   Temp: 98.6 F (37 C) 98.9 F (37.2 C) 98.3 F (36.8 C) 98.3 F (36.8 C)  TempSrc: Oral Oral Oral Oral  SpO2: 99% 100% 100% 98%  Weight:  55.9 kg    Height:        General exam: Appears calm and comfortable, no distress Respiratory system: Bilateral decreased breath sounds at bases Cardiovascular system: S1 & S2 heard, Rate controlled Gastrointestinal system: Abdomen is nondistended, soft and nontender. Normal bowel sounds heard. Extremities: No cyanosis, clubbing, edema  Central nervous system: Alert and oriented. No focal neurological deficits. Moving extremities    The results of significant diagnostics from this hospitalization (including imaging, microbiology, ancillary and laboratory) are listed below for reference.     Microbiology: Recent Results (from the past 240 hour(s))  CSF culture with Stat gram stain     Status: None (Preliminary result)   Collection Time: 09/05/18  3:03 AM  Result Value Ref Range Status   Specimen Description CSF  Final   Special Requests NONE  Final   Gram Stain   Final    WBC PRESENT, PREDOMINANTLY MONONUCLEAR NO ORGANISMS SEEN CYTOSPIN SMEAR    Culture   Final    NO GROWTH 1 DAY Performed at [facility], [address], [place], [place] [address]    Report Status PENDING  Incomplete  Culture, blood (routine x 2)     Status: None (Preliminary result)   Collection Time: 09/05/18  7:30 AM  Result Value Ref Range Status   Specimen Description BLOOD RIGHT ANTECUBITAL  Final   Special Requests   Final    BOTTLES DRAWN AEROBIC AND ANAEROBIC Blood Culture adequate volume   Culture   Final    NO GROWTH 1 DAY Performed at [facility], [address], [place],  [place] [address]    Report Status PENDING  Incomplete  Culture, blood (routine x 2)     Status: None (Preliminary result)   Collection Time: 09/05/18  7:45 AM  Result Value Ref Range Status   Specimen Description BLOOD RIGHT FOREARM  Final   Special Requests   Final    BOTTLES DRAWN AEROBIC AND ANAEROBIC Blood Culture adequate volume   Culture   Final    NO GROWTH 1 DAY Performed at [facility], [address], [place], [place] [address]    Report Status PENDING  Incomplete     Labs: BNP (last 3 results) No results for input(s): BNP in the last 8760 hours. Basic Metabolic Panel: Recent Labs  Lab 09/05/18 0034 09/06/18 0504  NA 140 139  K 4.2 4.1  CL 106 109  CO2 26 23  GLUCOSE 113* 150*  BUN 10 7  CREATININE 0.94 0.71  CALCIUM 9.0 8.8*  MG  --  2.0  Liver Function Tests: Recent Labs  Lab 09/05/18 0034 09/06/18 0504  AST 18 14*  ALT 12 11  ALKPHOS 36* 31*  BILITOT 0.5 0.3  PROT 6.8 5.9*  ALBUMIN 3.9 3.3*   Recent Labs  Lab 09/05/18 0034  LIPASE 56*   No results for input(s): AMMONIA in the last 168 hours. CBC: Recent Labs  Lab 09/05/18 0034 09/06/18 0505  WBC 3.1* 2.9*  HGB 13.2 11.6*  HCT 40.4 35.5*  MCV 84.9 84.7  PLT 287 322   Cardiac Enzymes: No results for input(s): CKTOTAL, CKMB, CKMBINDEX, TROPONINI in the last 168 hours. BNP: Invalid input(s): POCBNP CBG: No results for input(s): GLUCAP in the last 168 hours. D-Dimer No results for input(s): DDIMER in the last 72 hours. Hgb A1c No results for input(s): HGBA1C in the last 72 hours. Lipid Profile No results for input(s): CHOL, HDL, LDLCALC, TRIG, CHOLHDL, LDLDIRECT in the last 72 hours. Thyroid function studies No results for input(s): TSH, T4TOTAL, T3FREE, THYROIDAB in the last 72 hours.  Invalid input(s): FREET3 Anemia work up No results for input(s): VITAMINB12, FOLATE, FERRITIN, TIBC, IRON, RETICCTPCT in the last 72 hours. Urinalysis No results found for: COLORURINE,  APPEARANCEUR, LABSPEC, PHURINE, GLUCOSEU, HGBUR, BILIRUBINUR, KETONESUR, PROTEINUR, UROBILINOGEN, NITRITE, LEUKOCYTESUR Sepsis Labs Invalid input(s): PROCALCITONIN,  WBC,  LACTICIDVEN Microbiology Recent Results (from the past 240 hour(s))  CSF culture with Stat gram stain     Status: None (Preliminary result)   Collection Time: 09/05/18  3:03 AM  Result Value Ref Range Status   Specimen Description CSF  Final   Special Requests NONE  Final   Gram Stain   Final    WBC PRESENT, PREDOMINANTLY MONONUCLEAR NO ORGANISMS SEEN CYTOSPIN SMEAR    Culture   Final    NO GROWTH 1 DAY Performed at [facility], [address] [place], [place] [address]    Report Status PENDING  Incomplete  Culture, blood (routine x 2)     Status: None (Preliminary result)   Collection Time: 09/05/18  7:30 AM  Result Value Ref Range Status   Specimen Description BLOOD RIGHT ANTECUBITAL  Final   Special Requests   Final    BOTTLES DRAWN AEROBIC AND ANAEROBIC Blood Culture adequate volume   Culture   Final    NO GROWTH 1 DAY Performed at [facility], [address] [place], [place] [address]    Report Status PENDING  Incomplete  Culture, blood (routine x 2)     Status: None (Preliminary result)   Collection Time: 09/05/18  7:45 AM  Result Value Ref Range Status   Specimen Description BLOOD RIGHT FOREARM  Final   Special Requests   Final    BOTTLES DRAWN AEROBIC AND ANAEROBIC Blood Culture adequate volume   Culture   Final    NO GROWTH 1 DAY Performed at [facility], [address], [place], [place] [address]    Report Status PENDING  Incomplete     Time coordinating discharge: 35 minutes  SIGNED:   Glade Lloyd, MD  [facility] 09/06/2018, 4:24 PM Pager: [phone]  If 7PM-7AM, please contact night-coverage [email or web address] Password [email or web address]

## 2018-09-08 LAB — CSF CULTURE W GRAM STAIN

## 2018-09-08 LAB — HERPES SIMPLEX VIRUS(HSV) DNA BY PCR
HSV 1 DNA: NEGATIVE
HSV 2 DNA: NEGATIVE

## 2018-09-08 LAB — CSF CULTURE: CULTURE: NO GROWTH

## 2018-09-08 LAB — ENTEROVIRUS PCR: ENTEROVIRUS PCR: NEGATIVE

## 2018-09-08 NOTE — ED Notes (Signed)
Opened chart to answer question for admitting at [facility]

## 2018-09-10 LAB — CULTURE, BLOOD (ROUTINE X 2)
Culture: NO GROWTH
Culture: NO GROWTH
Special Requests: ADEQUATE
Special Requests: ADEQUATE

## 2019-06-04 ENCOUNTER — Other Ambulatory Visit: Payer: Self-pay | Admitting: *Deleted

## 2019-06-04 DIAGNOSIS — Z20822 Contact with and (suspected) exposure to covid-19: Secondary | ICD-10-CM

## 2019-06-05 ENCOUNTER — Other Ambulatory Visit: Payer: Self-pay

## 2019-06-05 DIAGNOSIS — Z20822 Contact with and (suspected) exposure to covid-19: Secondary | ICD-10-CM

## 2019-06-08 ENCOUNTER — Other Ambulatory Visit: Payer: Self-pay | Admitting: Internal Medicine

## 2019-06-08 DIAGNOSIS — Z20822 Contact with and (suspected) exposure to covid-19: Secondary | ICD-10-CM

## 2019-06-08 LAB — NOVEL CORONAVIRUS, NAA: SARS-CoV-2, NAA: NOT DETECTED

## 2019-06-10 LAB — NOVEL CORONAVIRUS, NAA: SARS-CoV-2, NAA: NOT DETECTED

## 2019-06-13 ENCOUNTER — Other Ambulatory Visit: Payer: Self-pay

## 2019-06-18 ENCOUNTER — Other Ambulatory Visit: Payer: Self-pay

## 2019-06-18 DIAGNOSIS — Z20822 Contact with and (suspected) exposure to covid-19: Secondary | ICD-10-CM

## 2019-06-19 LAB — NOVEL CORONAVIRUS, NAA: SARS-CoV-2, NAA: NOT DETECTED

## 2020-07-03 IMAGING — DX DG CHEST 2V
2 series · 2 of 2 positions shown · non-contrast
Comparison: None.

CLINICAL DATA: Fever, headache

EXAM:
CHEST - 2 VIEW

[chest lat]
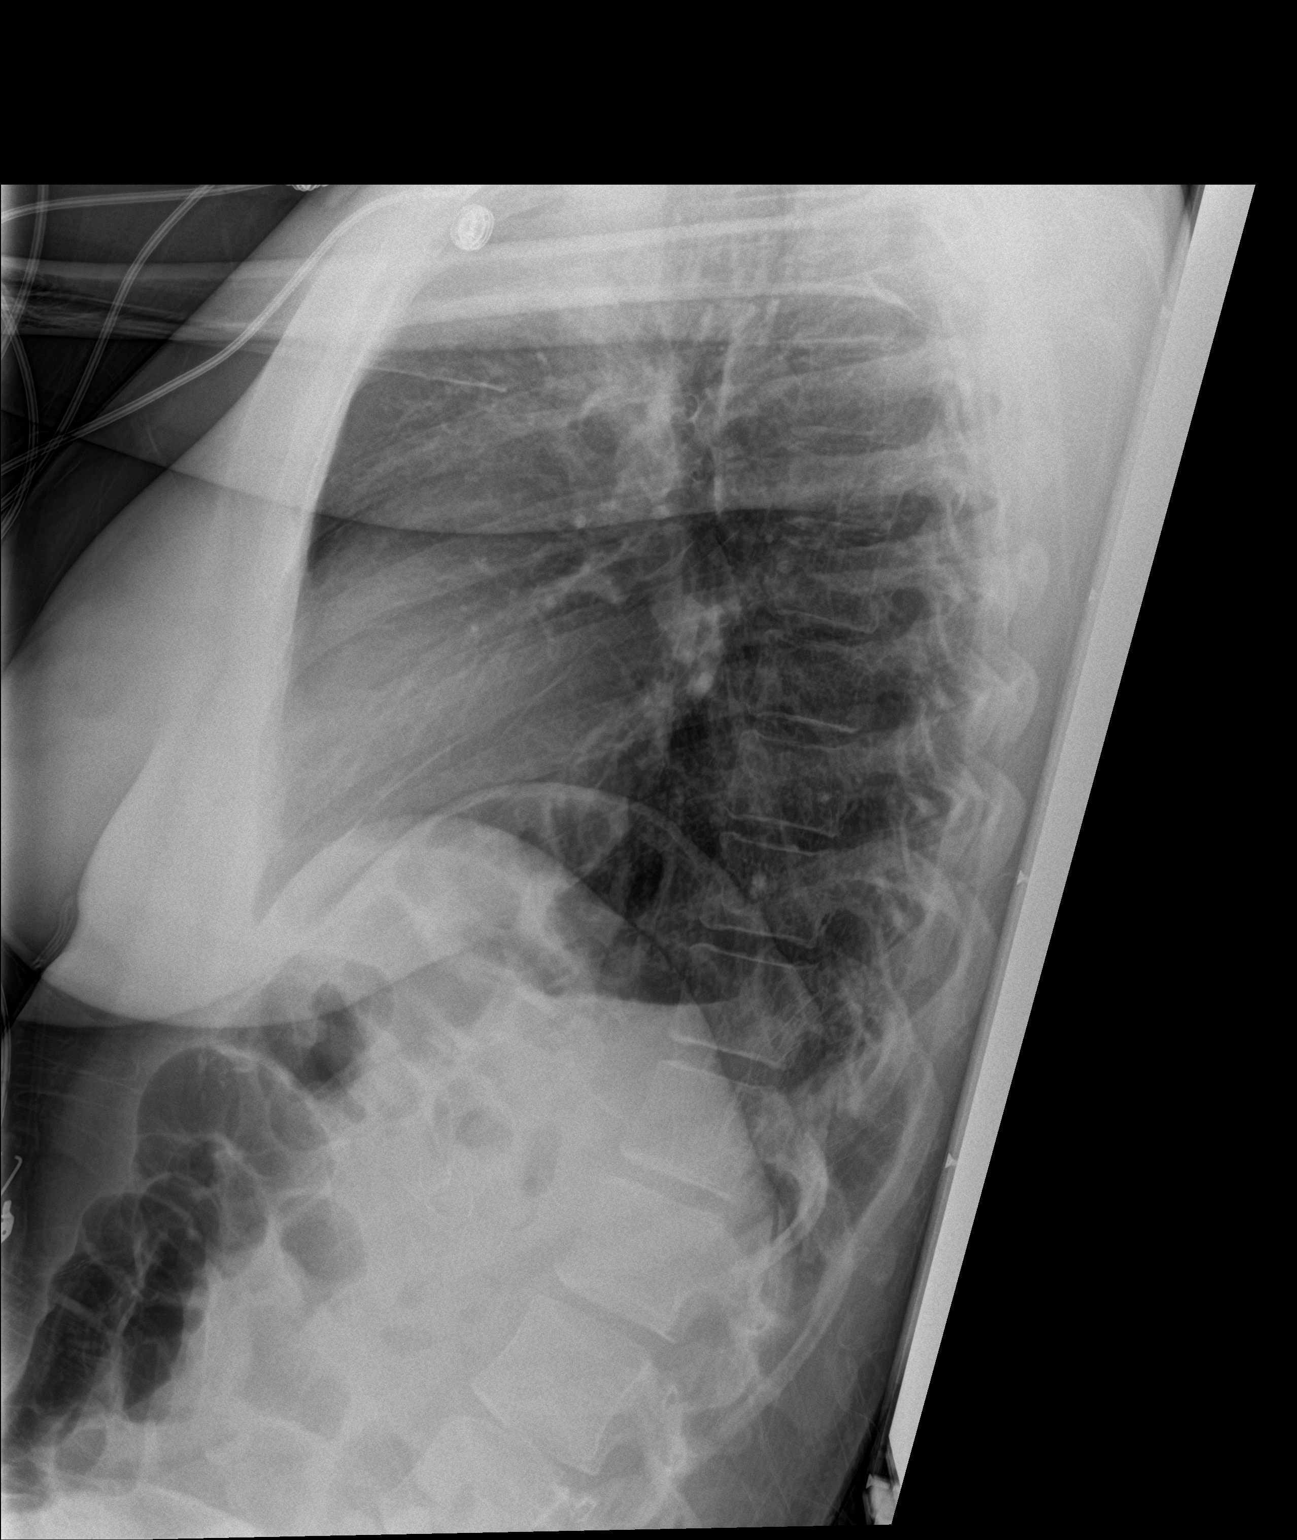

[chest ap]
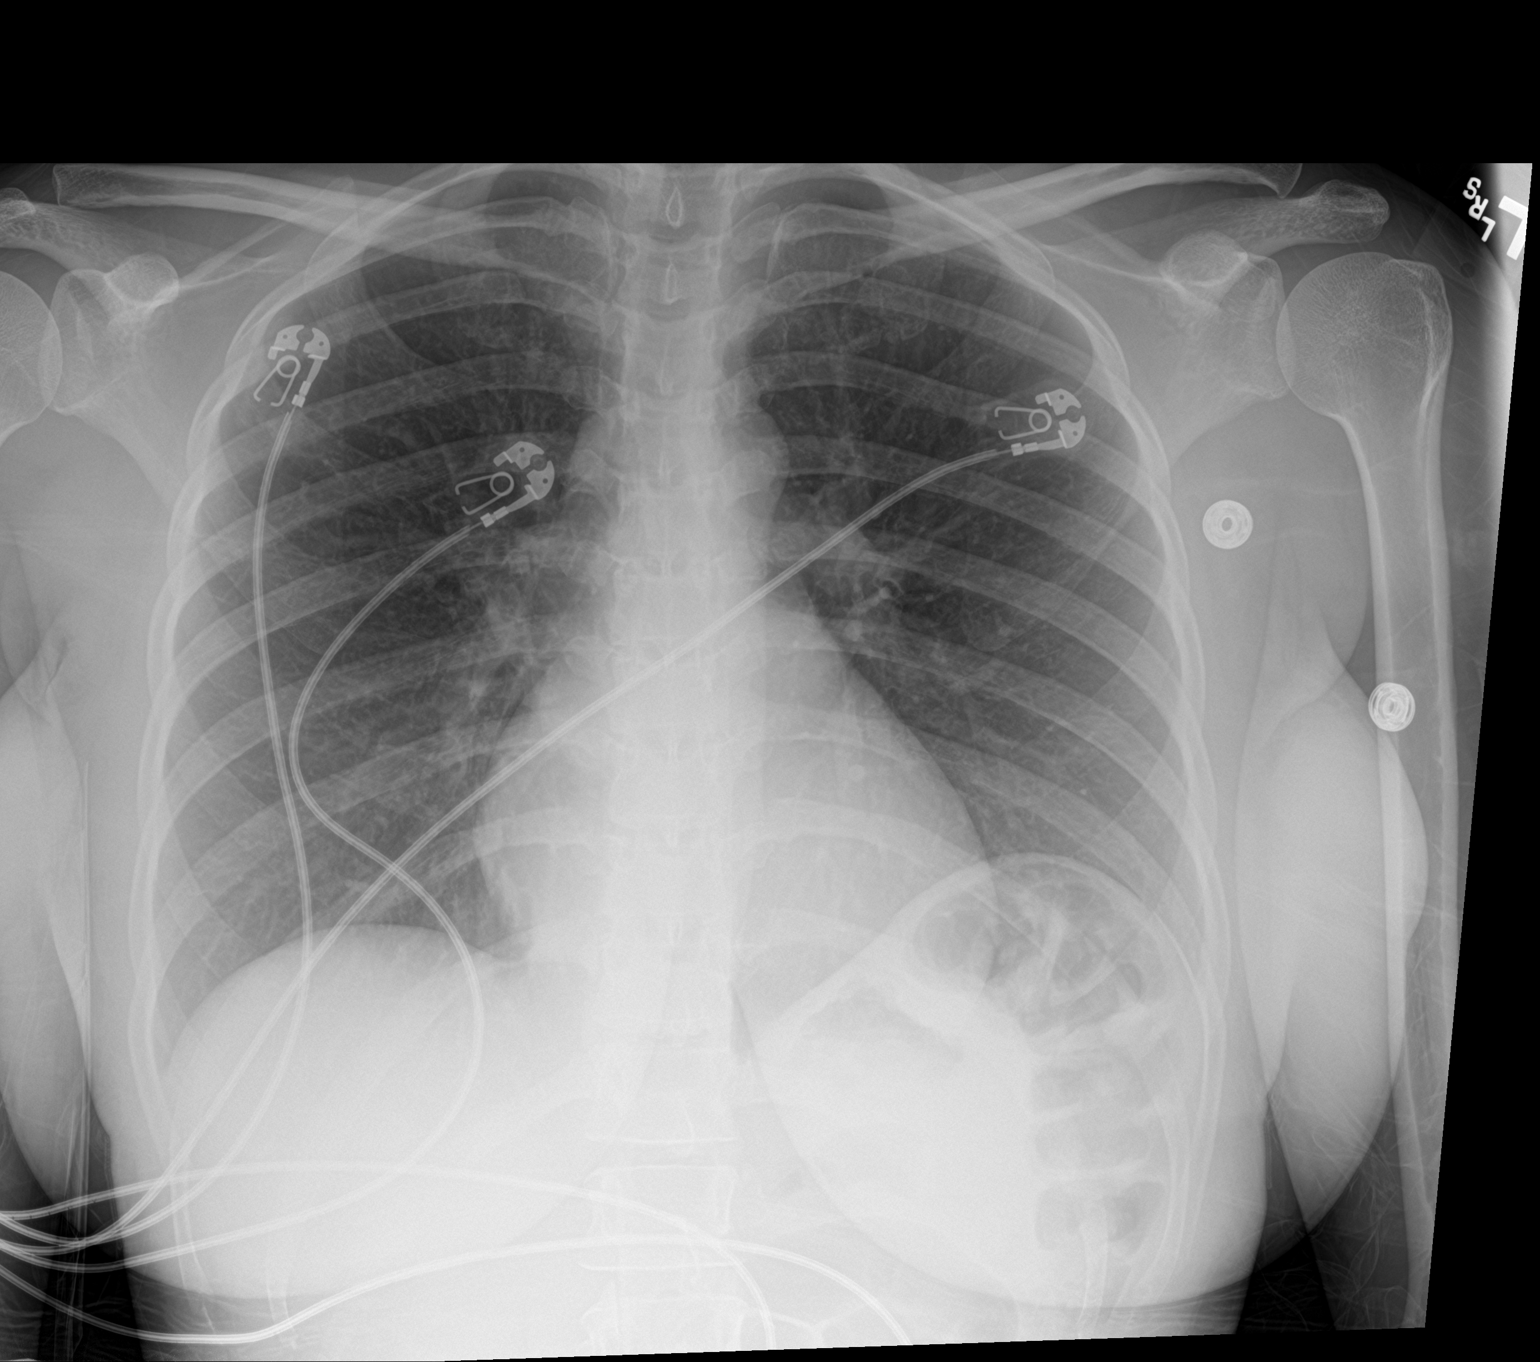

[2 of 2 positions shown; findings below may reference images not displayed]

FINDINGS: Heart and mediastinal contours are within normal limits. No focal
opacities or effusions. No acute bony abnormality.
IMPRESSION: No active cardiopulmonary disease.

## 2022-02-18 ENCOUNTER — Encounter (HOSPITAL_COMMUNITY): Payer: Self-pay | Admitting: Pharmacy Technician

## 2022-02-18 ENCOUNTER — Emergency Department (HOSPITAL_COMMUNITY)
Admission: EM | Admit: 2022-02-18 | Discharge: 2022-02-18 | Disposition: A | Discharge status: Home / Self Care | Payer: Self-pay | Attending: Physician Assistant | Admitting: Physician Assistant

## 2022-02-18 ENCOUNTER — Emergency Department (HOSPITAL_COMMUNITY)
Admission: EM | Admit: 2022-02-18 | Discharge: 2022-02-18 | Disposition: A | Discharge status: Home / Self Care | Payer: Self-pay | Attending: Emergency Medicine | Admitting: Emergency Medicine

## 2022-02-18 ENCOUNTER — Ambulatory Visit (HOSPITAL_COMMUNITY)
Admission: EM | Admit: 2022-02-18 | Discharge: 2022-02-18 | Disposition: A | Discharge status: Home / Self Care | Payer: No Typology Code available for payment source | Attending: Emergency Medicine | Admitting: Emergency Medicine

## 2022-02-18 ENCOUNTER — Encounter (HOSPITAL_COMMUNITY): Payer: Self-pay | Admitting: Emergency Medicine

## 2022-02-18 ENCOUNTER — Other Ambulatory Visit: Payer: Self-pay

## 2022-02-18 DIAGNOSIS — T7421XA Adult sexual abuse, confirmed, initial encounter: Secondary | ICD-10-CM | POA: Insufficient documentation

## 2022-02-18 DIAGNOSIS — Y92511 Restaurant or cafe as the place of occurrence of the external cause: Secondary | ICD-10-CM | POA: Insufficient documentation

## 2022-02-18 DIAGNOSIS — Z0441 Encounter for examination and observation following alleged adult rape: Secondary | ICD-10-CM | POA: Diagnosis present

## 2022-02-18 LAB — RAPID URINE DRUG SCREEN, HOSP PERFORMED
Amphetamines: NOT DETECTED
Barbiturates: NOT DETECTED
Benzodiazepines: NOT DETECTED
Cocaine: NOT DETECTED
Opiates: NOT DETECTED
Tetrahydrocannabinol: POSITIVE — AB

## 2022-02-18 LAB — URINALYSIS, ROUTINE W REFLEX MICROSCOPIC
Bilirubin Urine: NEGATIVE
Glucose, UA: NEGATIVE mg/dL
Ketones, ur: 5 mg/dL — AB
Nitrite: NEGATIVE
Protein, ur: NEGATIVE mg/dL
Specific Gravity, Urine: 1.011 (ref 1.005–1.030)
pH: 5 (ref 5.0–8.0)

## 2022-02-18 LAB — PREGNANCY, URINE: Preg Test, Ur: NEGATIVE

## 2022-02-18 MED ORDER — LIDOCAINE HCL (PF) 1 % IJ SOLN
INTRAMUSCULAR | Status: AC
  Filled 2022-02-18: qty 5

## 2022-02-18 MED ORDER — METRONIDAZOLE 500 MG PO TABS
2000.0000 mg | ORAL_TABLET | Freq: Once | ORAL | Status: AC
  Administered 2022-02-18: 2000 mg via ORAL
  Filled 2022-02-18: qty 4

## 2022-02-18 MED ORDER — ACETAMINOPHEN 325 MG PO TABS
650.0000 mg | ORAL_TABLET | Freq: Once | ORAL | Status: AC
  Administered 2022-02-18: 650 mg via ORAL
  Filled 2022-02-18: qty 2

## 2022-02-18 MED ORDER — LIDOCAINE HCL (PF) 1 % IJ SOLN
1.0000 mL | Freq: Once | INTRAMUSCULAR | Status: AC
  Administered 2022-02-18: 1 mL

## 2022-02-18 MED ORDER — STERILE WATER FOR INJECTION IJ SOLN
INTRAMUSCULAR | Status: AC
  Filled 2022-02-18: qty 10

## 2022-02-18 MED ORDER — AZITHROMYCIN 250 MG PO TABS
1000.0000 mg | ORAL_TABLET | Freq: Once | ORAL | Status: AC
  Administered 2022-02-18: 1000 mg via ORAL
  Filled 2022-02-18: qty 4

## 2022-02-18 MED ORDER — CEFTRIAXONE SODIUM 500 MG IJ SOLR
500.0000 mg | Freq: Once | INTRAMUSCULAR | Status: AC
  Administered 2022-02-18: 500 mg via INTRAMUSCULAR
  Filled 2022-02-18: qty 500
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
## 2022-02-18 NOTE — ED Notes (Signed)
SANE RN at the bedside.  

## 2022-02-18 NOTE — SANE Note (Signed)
The SANE/FNE Naval architect) consult has been completed. The primary RN and/or provider have been notified. Please contact the SANE/FNE nurse on call (listed in [place]) with any further concerns. ? ?Patient is aware that she can come back within 5 days for full evaluation. And if she decides to not return, I advised that Plan B is over the counter, and that she should see a provider for pregnancy and STD testing in 10-14 days. Patient and her cousin Bria verbalized understanding. ? ?
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
## 2022-09-13 ENCOUNTER — Other Ambulatory Visit (HOSPITAL_BASED_OUTPATIENT_CLINIC_OR_DEPARTMENT_OTHER): Payer: Self-pay

## 2022-09-13 MED ORDER — INFLUENZA VAC SPLIT QUAD 0.5 ML IM SUSY
PREFILLED_SYRINGE | INTRAMUSCULAR | 0 refills | Status: AC
  Filled 2022-09-13: qty 0.5, 1d supply, fill #0
